# Patient Record
Sex: Female | Born: 1940 | Race: White | Hispanic: No | Smoking: Never smoker
Health system: Southern US, Community
[De-identification: ages and names within clinical notes are randomized; demographics above are authoritative.]

## PROBLEM LIST (undated history)

## (undated) DIAGNOSIS — K219 Gastro-esophageal reflux disease without esophagitis: Secondary | ICD-10-CM

## (undated) DIAGNOSIS — I10 Essential (primary) hypertension: Secondary | ICD-10-CM

## (undated) DIAGNOSIS — E119 Type 2 diabetes mellitus without complications: Secondary | ICD-10-CM

## (undated) HISTORY — PX: ABDOMINAL HYSTERECTOMY: SHX81

## (undated) HISTORY — PX: SHOULDER SURGERY: SHX246

---

## 2013-08-07 ENCOUNTER — Emergency Department (HOSPITAL_BASED_OUTPATIENT_CLINIC_OR_DEPARTMENT_OTHER): Payer: Medicare HMO

## 2013-08-07 ENCOUNTER — Encounter (HOSPITAL_BASED_OUTPATIENT_CLINIC_OR_DEPARTMENT_OTHER): Payer: Self-pay | Admitting: Emergency Medicine

## 2013-08-07 ENCOUNTER — Emergency Department (HOSPITAL_BASED_OUTPATIENT_CLINIC_OR_DEPARTMENT_OTHER)
Admission: EM | Admit: 2013-08-07 | Discharge: 2013-08-07 | Disposition: A | Discharge status: Home / Self Care | Payer: Medicare HMO | Attending: Emergency Medicine | Admitting: Emergency Medicine

## 2013-08-07 DIAGNOSIS — L02419 Cutaneous abscess of limb, unspecified: Secondary | ICD-10-CM | POA: Insufficient documentation

## 2013-08-07 DIAGNOSIS — R600 Localized edema: Secondary | ICD-10-CM

## 2013-08-07 DIAGNOSIS — R0989 Other specified symptoms and signs involving the circulatory and respiratory systems: Secondary | ICD-10-CM | POA: Insufficient documentation

## 2013-08-07 DIAGNOSIS — R0602 Shortness of breath: Secondary | ICD-10-CM | POA: Insufficient documentation

## 2013-08-07 DIAGNOSIS — E119 Type 2 diabetes mellitus without complications: Secondary | ICD-10-CM | POA: Insufficient documentation

## 2013-08-07 DIAGNOSIS — R0609 Other forms of dyspnea: Secondary | ICD-10-CM | POA: Insufficient documentation

## 2013-08-07 DIAGNOSIS — I10 Essential (primary) hypertension: Secondary | ICD-10-CM | POA: Insufficient documentation

## 2013-08-07 DIAGNOSIS — L03116 Cellulitis of left lower limb: Secondary | ICD-10-CM

## 2013-08-07 DIAGNOSIS — Z7982 Long term (current) use of aspirin: Secondary | ICD-10-CM | POA: Insufficient documentation

## 2013-08-07 DIAGNOSIS — K219 Gastro-esophageal reflux disease without esophagitis: Secondary | ICD-10-CM | POA: Insufficient documentation

## 2013-08-07 DIAGNOSIS — R609 Edema, unspecified: Secondary | ICD-10-CM | POA: Insufficient documentation

## 2013-08-07 DIAGNOSIS — L03119 Cellulitis of unspecified part of limb: Secondary | ICD-10-CM

## 2013-08-07 DIAGNOSIS — Z79899 Other long term (current) drug therapy: Secondary | ICD-10-CM | POA: Insufficient documentation

## 2013-08-07 HISTORY — DX: Type 2 diabetes mellitus without complications: E11.9

## 2013-08-07 HISTORY — DX: Gastro-esophageal reflux disease without esophagitis: K21.9

## 2013-08-07 HISTORY — DX: Essential (primary) hypertension: I10

## 2013-08-07 LAB — CBC WITH DIFFERENTIAL/PLATELET
BASOS ABS: 0.1 10*3/uL (ref 0.0–0.1)
Basophils Relative: 1 % (ref 0–1)
EOS ABS: 0.3 10*3/uL (ref 0.0–0.7)
Eosinophils Relative: 5 % (ref 0–5)
HCT: 33.4 % — ABNORMAL LOW (ref 36.0–46.0)
HEMOGLOBIN: 10.6 g/dL — AB (ref 12.0–15.0)
Lymphocytes Relative: 27 % (ref 12–46)
Lymphs Abs: 2 10*3/uL (ref 0.7–4.0)
MCH: 27.5 pg (ref 26.0–34.0)
MCHC: 31.7 g/dL (ref 30.0–36.0)
MCV: 86.5 fL (ref 78.0–100.0)
MONOS PCT: 12 % (ref 3–12)
Monocytes Absolute: 0.9 10*3/uL (ref 0.1–1.0)
NEUTROS ABS: 4.1 10*3/uL (ref 1.7–7.7)
NEUTROS PCT: 56 % (ref 43–77)
Platelets: 256 10*3/uL (ref 150–400)
RBC: 3.86 MIL/uL — ABNORMAL LOW (ref 3.87–5.11)
RDW: 14.8 % (ref 11.5–15.5)
WBC: 7.4 10*3/uL (ref 4.0–10.5)

## 2013-08-07 LAB — TROPONIN I: Troponin I: <0.3 ng/mL (ref <0.30)

## 2013-08-07 LAB — BASIC METABOLIC PANEL
ANION GAP: 14 (ref 5–15)
BUN: 24 mg/dL — ABNORMAL HIGH (ref 6–23)
CHLORIDE: 102 meq/L (ref 96–112)
CO2: 25 mEq/L (ref 19–32)
Calcium: 9.5 mg/dL (ref 8.4–10.5)
Creatinine, Ser: 1.1 mg/dL (ref 0.50–1.10)
GFR, EST AFRICAN AMERICAN: 56 mL/min — AB (ref >90)
GFR, EST NON AFRICAN AMERICAN: 48 mL/min — AB (ref >90)
Glucose, Bld: 106 mg/dL — ABNORMAL HIGH (ref 70–99)
POTASSIUM: 4.4 meq/L (ref 3.7–5.3)
Sodium: 141 mEq/L (ref 137–147)

## 2013-08-07 LAB — PRO B NATRIURETIC PEPTIDE: PRO B NATRI PEPTIDE: 602.9 pg/mL — AB (ref 0–125)

## 2013-08-07 MED ORDER — CLINDAMYCIN HCL 150 MG PO CAPS: 450.0000 mg | ORAL_CAPSULE | Freq: Three times a day (TID) | ORAL | Status: AC

## 2013-08-07 NOTE — ED Notes (Signed)
Left leg pain for a week. Lower leg is red, swollen, painful with blisters on her skin.

## 2013-08-07 NOTE — ED Provider Notes (Signed)
CSN: [id number]     Arrival date & time 08/07/13  1457 History   First MD Initiated Contact with Patient 08/07/13 1528     Chief Complaint  Patient presents with  . Leg Pain     (Consider location/radiation/quality/duration/timing/severity/associated sxs/prior Treatment) Patient is a 73 y.o. female presenting with leg pain and shortness of breath.  Leg Pain Location:  Leg Leg location:  L lower leg Pain details:    Quality:  Burning   Radiates to:  Does not radiate   Severity:  Moderate   Onset quality:  Gradual   Duration: several days.   Timing:  Constant   Progression:  Worsening Chronicity:  Recurrent ("last time they treated me with antibiotics") Relieved by:  Nothing Worsened by:  Nothing tried Associated symptoms: swelling (BLE)   Associated symptoms: no fever and no numbness   Shortness of Breath Severity:  Moderate Onset quality:  Gradual Duration:  1 week Timing:  Constant Progression:  Worsening Chronicity:  New Context comment:  Working at a [employer] for past 3 weeks, been on feet much more than usual. \ Relieved by:  Nothing Exacerbated by: lying flat, exertion. Associated symptoms: no abdominal pain, no chest pain, no cough, no fever and no vomiting     Past Medical History  Diagnosis Date  . Hypertension   . Diabetes mellitus without complication   . GERD (gastroesophageal reflux disease)    Past Surgical History  Procedure Laterality Date  . Shoulder surgery    . Abdominal hysterectomy     No family history on file. History  Substance Use Topics  . Smoking status: Never Smoker   . Smokeless tobacco: Not on file  . Alcohol Use: No   OB History   Grav Para Term Preterm Abortions TAB SAB Ect Mult Living                 Review of Systems  Constitutional: Negative for fever.  Respiratory: Positive for shortness of breath. Negative for cough.   Cardiovascular: Negative for chest pain.  Gastrointestinal: Negative for nausea,  vomiting, abdominal pain and diarrhea.  All other systems reviewed and are negative.     Allergies  Review of patient's allergies indicates no known allergies.  Home Medications   Prior to Admission medications   Medication Sig Start Date End Date Taking? Authorizing Provider  Aspirin (ASPIR-81 PO) Take by mouth.   Yes Historical Provider, MD  LOVASTATIN PO Take by mouth.   Yes Historical Provider, MD  METFORMIN HCL PO Take by mouth.   Yes Historical Provider, MD  Omeprazole (PRILOSEC PO) Take by mouth.   Yes Historical Provider, MD  verapamil (CALAN) 120 MG tablet Take 120 mg by mouth 2 (two) times daily.   Yes Historical Provider, MD   BP 157/58  Pulse 72  Temp(Src) 97.9 F (36.6 C) (Oral)  Resp 18  Ht 5\' 2"  (1.575 m)  Wt 220 lb (99.791 kg)  BMI 40.23 kg/m2  SpO2 97% Physical Exam  Nursing note and vitals reviewed. Constitutional: She is oriented to person, place, and time. She appears well-developed and well-nourished. No distress.  HENT:  Head: Normocephalic and atraumatic.  Mouth/Throat: Oropharynx is clear and moist.  Eyes: Conjunctivae are normal. Pupils are equal, round, and reactive to light. No scleral icterus.  Neck: Neck supple.  Cardiovascular: Normal rate, normal heart sounds and intact distal pulses.  An irregularly irregular rhythm present.  No murmur heard. Pulmonary/Chest: Effort normal. No stridor. No respiratory  distress. She has rales (mild, bibasilar. Greater on left than right. ).  Abdominal: Soft. Bowel sounds are normal. She exhibits no distension. There is no tenderness. There is no guarding.  Musculoskeletal: Normal range of motion. She exhibits edema (BLE.  Pitting worse on left, but overall circumference on right is noticeably greater. ).  Neurological: She is alert and oriented to person, place, and time.  Skin: Skin is warm and dry. No rash noted.  Left lower leg: Erythema of ankle spreading slightly into foot.  On anterior shin there are a  few ulcerations.  No vesicles.    Psychiatric: She has a normal mood and affect. Her behavior is normal.    ED Course  Procedures (including critical care time) Labs Review Labs Reviewed  CBC WITH DIFFERENTIAL - Abnormal; Notable for the following:    RBC 3.86 (*)    Hemoglobin 10.6 (*)    HCT 33.4 (*)    All other components within normal limits  BASIC METABOLIC PANEL - Abnormal; Notable for the following:    Glucose, Bld 106 (*)    BUN 24 (*)    GFR calc non Af Amer 48 (*)    GFR calc Af Amer 56 (*)    All other components within normal limits  PRO B NATRIURETIC PEPTIDE - Abnormal; Notable for the following:    Pro B Natriuretic peptide (BNP) 602.9 (*)    All other components within normal limits  TROPONIN I    Imaging Review Dg Chest 2 View  08/07/2013   CLINICAL DATA:  Shortness of breath, lower extremity edema  EXAM: CHEST  2 VIEW  COMPARISON:  None.  FINDINGS: Mild to moderate cardiac enlargement. Vascular pattern normal. No edema or consolidation. No pleural effusions.  IMPRESSION: Cardiac enlargement with no acute findings.   Electronically Signed   By: Esperanza Heir M.D.   On: 08/07/2013 16:43   US Venous Img Lower Bilateral  08/07/2013   CLINICAL DATA:  Bilateral leg swelling.  EXAM: BILATERAL LOWER EXTREMITY VENOUS DOPPLER ULTRASOUND  TECHNIQUE: Gray-scale sonography with graded compression, as well as color Doppler and duplex ultrasound were performed to evaluate the lower extremity deep venous systems from the level of the common femoral vein and including the common femoral, femoral, profunda femoral, popliteal and calf veins including the posterior tibial, peroneal and gastrocnemius veins when visible. The superficial great saphenous vein was also interrogated. Spectral Doppler was utilized to evaluate flow at rest and with distal augmentation maneuvers in the common femoral, femoral and popliteal veins.  COMPARISON:  None.  FINDINGS: RIGHT LOWER EXTREMITY  Common Femoral  Vein: No evidence of thrombus. Normal compressibility, respiratory phasicity and response to augmentation.  Saphenofemoral Junction: No evidence of thrombus. Normal compressibility and flow on color Doppler imaging.  Profunda Femoral Vein: No evidence of thrombus. Normal compressibility and flow on color Doppler imaging.  Femoral Vein: No evidence of thrombus. Normal compressibility, respiratory phasicity and response to augmentation.  Popliteal Vein: No evidence of thrombus. Normal compressibility, respiratory phasicity and response to augmentation.  Calf Veins: No evidence of thrombus. Normal compressibility and flow on color Doppler imaging.  Superficial Great Saphenous Vein: No evidence of thrombus. Normal compressibility and flow on color Doppler imaging.  Venous Reflux:  None.  Other Findings:  None.  LEFT LOWER EXTREMITY  Common Femoral Vein: No evidence of thrombus. Normal compressibility, respiratory phasicity and response to augmentation.  Saphenofemoral Junction: No evidence of thrombus. Normal compressibility and flow on color Doppler imaging.  Profunda Femoral Vein: No evidence of thrombus. Normal compressibility and flow on color Doppler imaging.  Femoral Vein: No evidence of thrombus. Normal compressibility, respiratory phasicity and response to augmentation.  Popliteal Vein: No evidence of thrombus. Normal compressibility, respiratory phasicity and response to augmentation.  Calf Veins: No evidence of thrombus. Normal compressibility and flow on color Doppler imaging.  Superficial Great Saphenous Vein: No evidence of thrombus. Normal compressibility and flow on color Doppler imaging.  Venous Reflux:  None.  Other Findings: There is a thrombosed superficial varicosity in the medial upper calf area which was very tender to touch.  IMPRESSION: No evidence of deep venous thrombosis in bilateral lower extremities.  Superficial thrombophlebitis in a superficial varicosity in the medial upper calf area on  the left.   Electronically Signed   By: Loralie ChampagneMark  Gallerani M.D.   On: 08/07/2013 17:48     EKG Interpretation   Date/Time:  Thursday August 07 2013 16:17:02 EDT Ventricular Rate:  62 PR Interval:  208 QRS Duration: 94 QT Interval:  426 QTC Calculation: 432 R Axis:   6 Text Interpretation:  Sinus rhythm with marked sinus arrhythmia Otherwise  normal ECG No old tracing to compare Confirmed by [facility]  MD, TREY ([id number])  on 08/07/2013 6:15:53 PM      MDM   Final diagnoses:  Bilateral lower extremity edema  Cellulitis of left leg    73 year old female presenting with left lower leg pain, swelling, redness. These symptoms have progressed over the past week. He also endorses shortness of breath, dyspnea on exertion, orthopnea. On exam, both legs are edematous. Left lower leg appears cellulitic. Lower extremity circumferences are different, so will obtain ultrasound. We'll also check chest x-ray, BNP.  US negative for DVT.  Remainder of workup unremarkable.  Plan dc home with treatment for her cellulitis.    Candyce ChurnJohn David Shakir Petrosino III, MD 08/07/13 1900

## 2013-08-07 NOTE — Discharge Instructions (Signed)
Cellulitis  Cellulitis is an infection of the skin and the tissue beneath it. The infected area is usually red and tender. Cellulitis occurs most often in the arms and lower legs.   CAUSES   Cellulitis is caused by bacteria that enter the skin through cracks or cuts in the skin. The most common types of bacteria that cause cellulitis are Staphylococcus and Streptococcus.  SYMPTOMS    Redness and warmth.   Swelling.   Tenderness or pain.   Fever.  DIAGNOSIS   Your caregiver can usually determine what is wrong based on a physical exam. Blood tests may also be done.  TREATMENT   Treatment usually involves taking an antibiotic medicine.  HOME CARE INSTRUCTIONS    Take your antibiotics as directed. Finish them even if you start to feel better.   Keep the infected arm or leg elevated to reduce swelling.   Apply a warm cloth to the affected area up to 4 times per day to relieve pain.   Only take over-the-counter or prescription medicines for pain, discomfort, or fever as directed by your caregiver.   Keep all follow-up appointments as directed by your caregiver.  SEEK MEDICAL CARE IF:    You notice red streaks coming from the infected area.   Your red area gets larger or turns dark in color.   Your bone or joint underneath the infected area becomes painful after the skin has healed.   Your infection returns in the same area or another area.   You notice a swollen bump in the infected area.   You develop new symptoms.  SEEK IMMEDIATE MEDICAL CARE IF:    You have a fever.   You feel very sleepy.   You develop vomiting or diarrhea.   You have a general ill feeling (malaise) with muscle aches and pains.  MAKE SURE YOU:    Understand these instructions.   Will watch your condition.   Will get help right away if you are not doing well or get worse.  Document Released: 11/02/2004 Document Revised: 07/25/2011 Document Reviewed: 04/10/2011  Exit[employer] Patient Information 2015 [employer]. This information is  not intended to replace advice given to you by your health care provider. Make sure you discuss any questions you have with your health care provider.    Edema  Edema is an abnormal buildup of fluids in your bodytissues. Edema is somewhatdependent on gravity to pull the fluid to the lowest place in your body. That makes the condition more common in the legs and thighs (lower extremities). Painless swelling of the feet and ankles is common and becomes more likely as you get older. It is also common in looser tissues, like around your eyes.   When the affected area is squeezed, the fluid may move out of that spot and leave a dent for a few moments. This dent is called pitting.   CAUSES   There are many possible causes of edema. Eating too much salt and being on your feet or sitting for a long time can cause edema in your legs and ankles. Hot weather may make edema worse. Common medical causes of edema include:   Heart failure.   Liver disease.   Kidney disease.   Weak blood vessels in your legs.   Cancer.   An injury.   Pregnancy.   Some medications.   Obesity.  SYMPTOMS   Edema is usually painless.Your skin may look swollen or shiny.   DIAGNOSIS   Your health   care provider may be able to diagnose edema by asking about your medical history and doing a physical exam. You may need to have tests such as X-rays, an electrocardiogram, or blood tests to check for medical conditions that may cause edema.   TREATMENT   Edema treatment depends on the cause. If you have heart, liver, or kidney disease, you need the treatment appropriate for these conditions. General treatment may include:   Elevation of the affected body part above the level of your heart.   Compression of the affected body part. Pressure from elastic bandages or support stockings squeezes the tissues and forces fluid back into the blood vessels. This keeps fluid from entering the tissues.   Restriction of fluid and salt intake.   Use of a  water pill (diuretic). These medications are appropriate only for some types of edema. They pull fluid out of your body and make you urinate more often. This gets rid of fluid and reduces swelling, but diuretics can have side effects. Only use diuretics as directed by your health care provider.  HOME CARE INSTRUCTIONS    Keep the affected body part above the level of your heart when you are lying down.    Do not sit still or stand for prolonged periods.    Do not put anything directly under your knees when lying down.   Do not wear constricting clothing or garters on your upper legs.    Exercise your legs to work the fluid back into your blood vessels. This may help the swelling go down.    Wear elastic bandages or support stockings to reduce ankle swelling as directed by your health care provider.    Eat a low-salt diet to reduce fluid if your health care provider recommends it.    Only take medicines as directed by your health care provider.  SEEK MEDICAL CARE IF:    Your edema is not responding to treatment.   You have heart, liver, or kidney disease and notice symptoms of edema.   You have edema in your legs that does not improve after elevating them.    You have sudden and unexplained weight gain.  SEEK IMMEDIATE MEDICAL CARE IF:    You develop shortness of breath or chest pain.    You cannot breathe when you lie down.   You develop pain, redness, or warmth in the swollen areas.    You have heart, liver, or kidney disease and suddenly get edema.   You have a fever and your symptoms suddenly get worse.  MAKE SURE YOU:    Understand these instructions.   Will watch your condition.   Will get help right away if you are not doing well or get worse.  Document Released: 01/23/2005 Document Revised: 01/28/2013 Document Reviewed: 11/15/2012  [employer] Patient Information 2015 [employer]. This information is not intended to replace advice given to you by your health care provider. Make  sure you discuss any questions you have with your health care provider.

## 2014-11-10 IMAGING — CR DG CHEST 2V
1 series · 1 of 1 positions shown · non-contrast
Comparison: None.

CLINICAL DATA: Shortness of breath, lower extremity edema

EXAM:
CHEST  2 VIEW

[view not recorded]
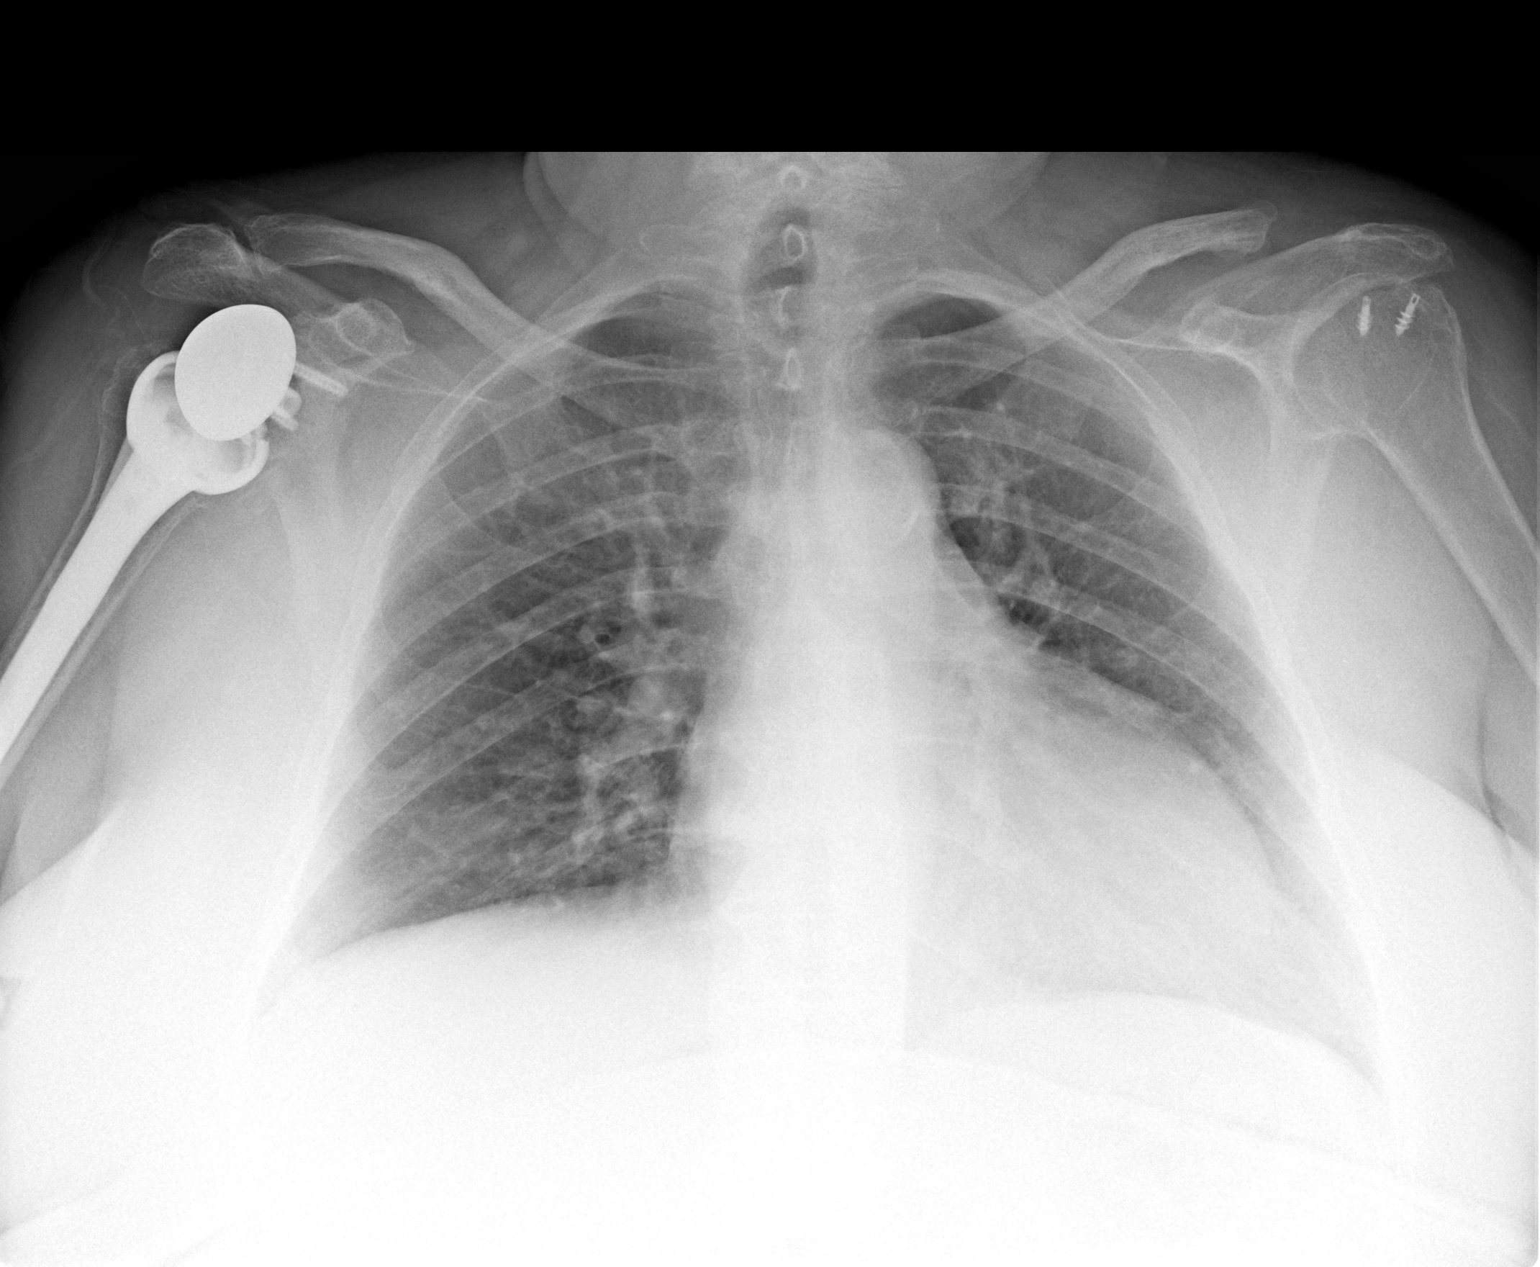

[1 of 1 positions shown; findings below may reference images not displayed]

FINDINGS: Mild to moderate cardiac enlargement. Vascular pattern normal. No
edema or consolidation. No pleural effusions.
IMPRESSION: Cardiac enlargement with no acute findings.

## 2014-11-10 IMAGING — US US EXTREM LOW VENOUS BILAT
1 series · 13 of 24 positions shown · non-contrast
Comparison: None.

CLINICAL DATA: Bilateral leg swelling.



[Series 1: us extrem low venous bilat · 0.16mm/px · 41 acquisitions, 13 frames shown]
[im 1/41]
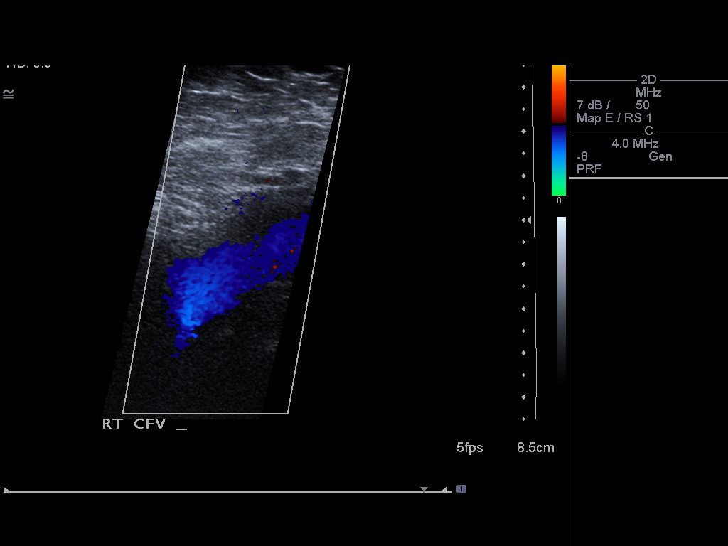
[im 4/41]
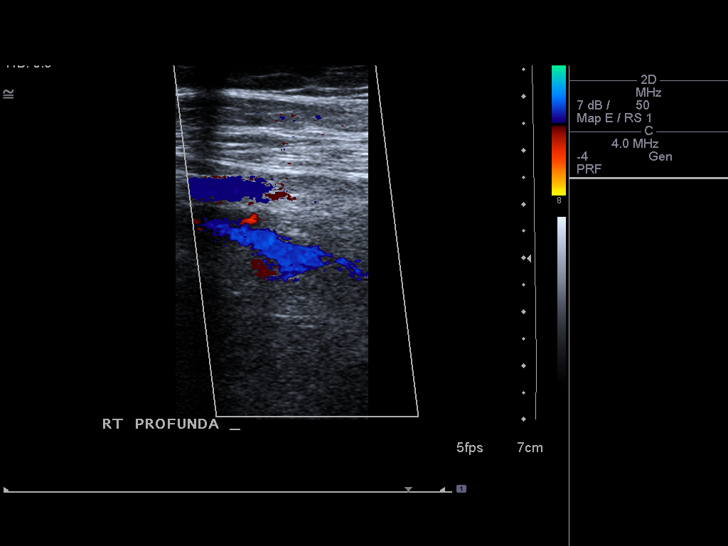
[im 7/41]
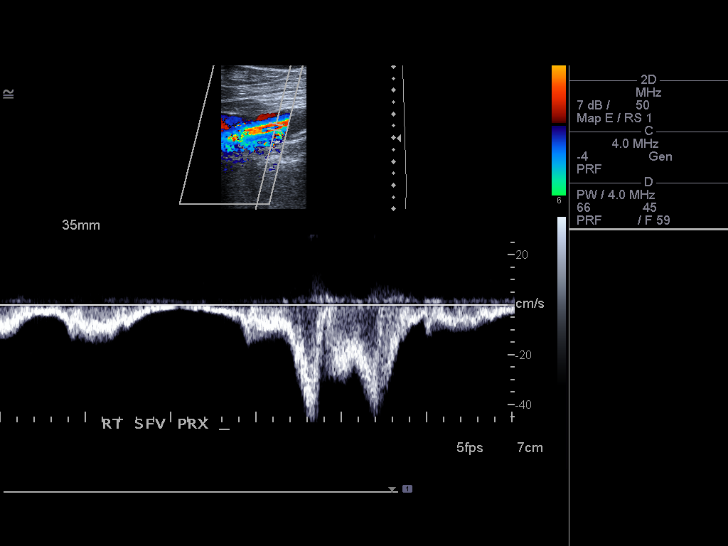
[im 11/41]
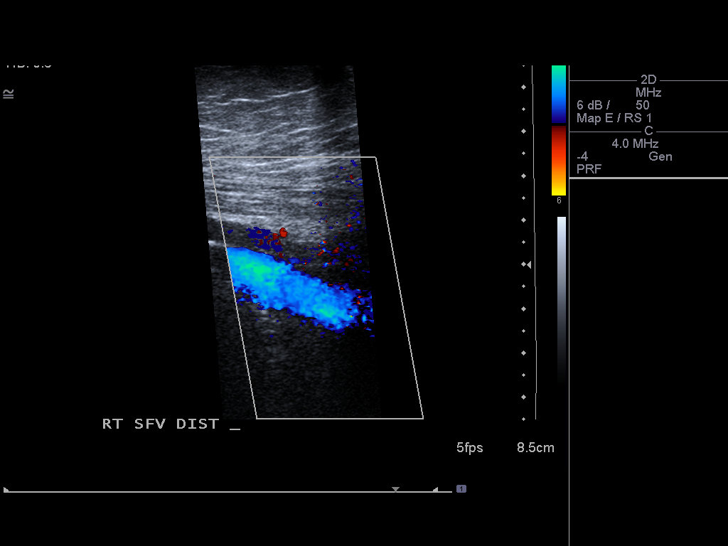
[im 14/41]
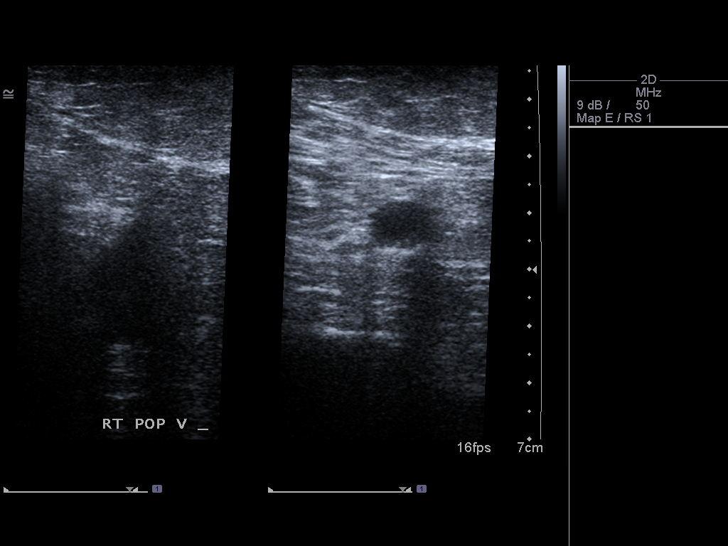
[im 16/41]
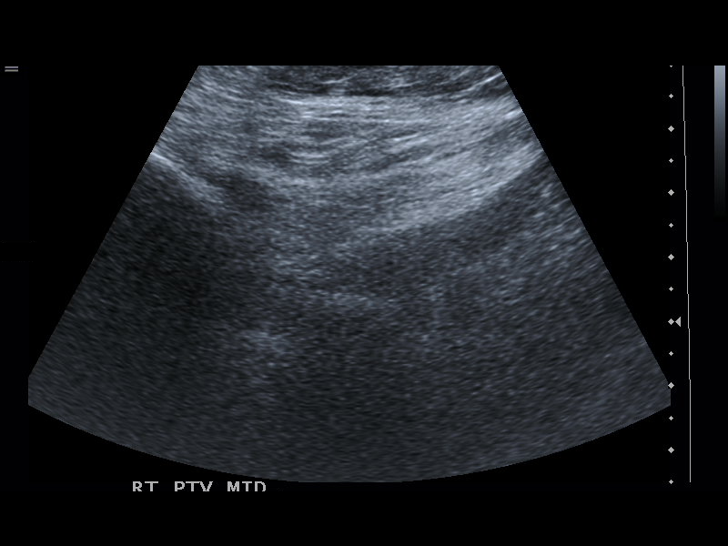
[im 21/41]
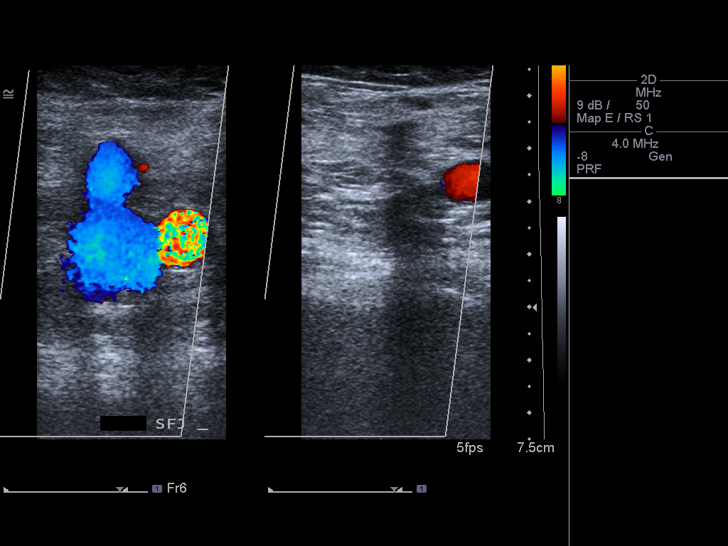
[im 23/41]
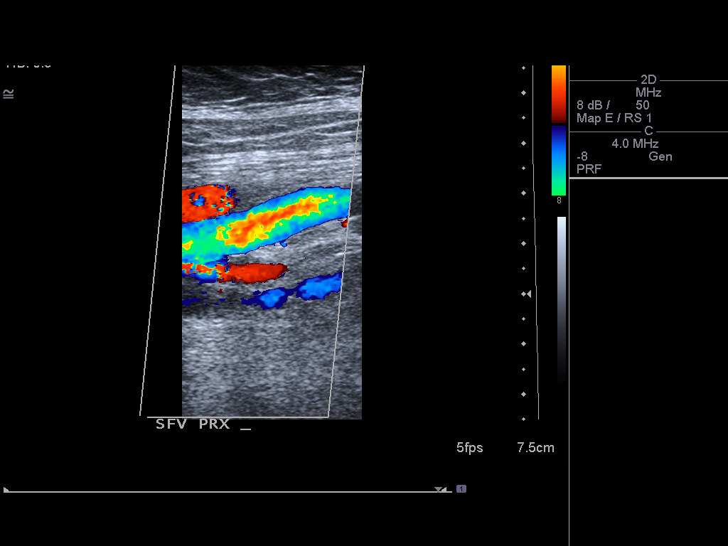
[im 27/41]
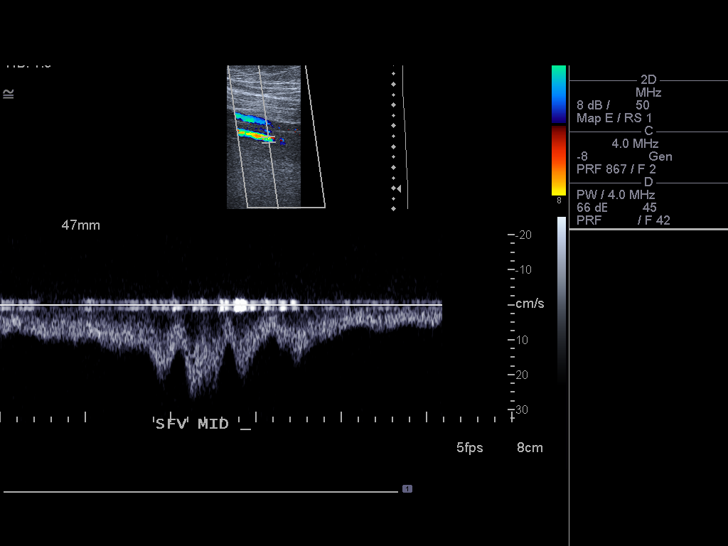
[im 30/41]
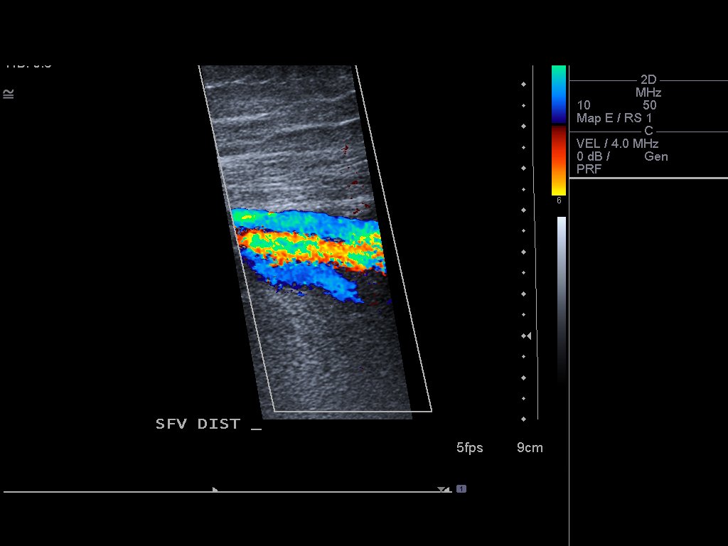
[im 34/41]
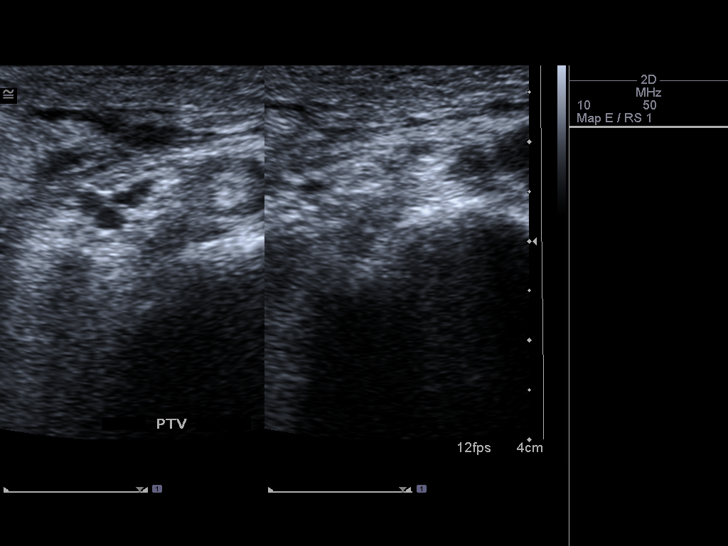
[im 37/41]
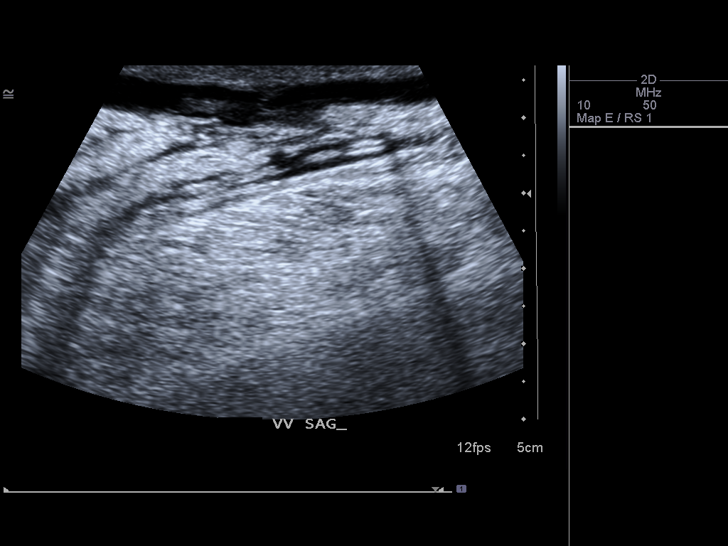
[im 41/41]
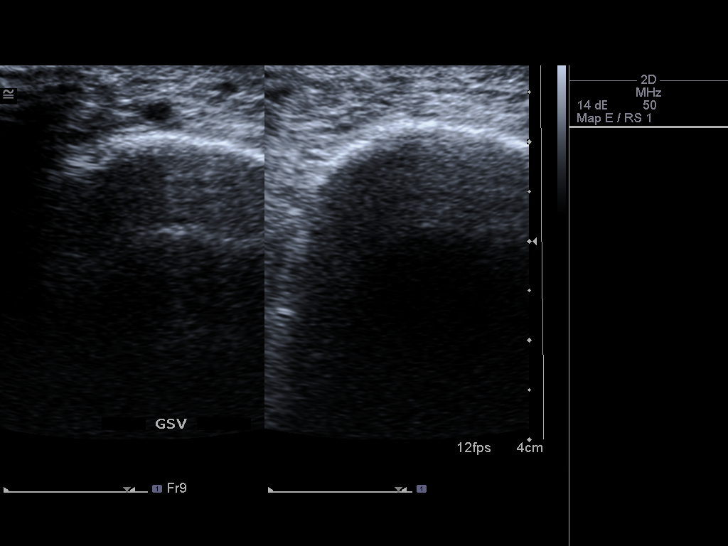

[13 of 24 positions shown; findings below may reference images not displayed]

FINDINGS: RIGHT LOWER EXTREMITY

Common Femoral Vein: No evidence of thrombus. Normal
compressibility, respiratory phasicity and response to augmentation.

Saphenofemoral Junction: No evidence of thrombus. Normal
compressibility and flow on color Doppler imaging.

Profunda Femoral Vein: No evidence of thrombus. Normal
compressibility and flow on color Doppler imaging.

Femoral Vein: No evidence of thrombus. Normal compressibility,
respiratory phasicity and response to augmentation.

Popliteal Vein: No evidence of thrombus. Normal compressibility,
respiratory phasicity and response to augmentation.

Calf Veins: No evidence of thrombus. Normal compressibility and flow
on color Doppler imaging.

Superficial Great Saphenous Vein: No evidence of thrombus. Normal
compressibility and flow on color Doppler imaging.

Venous Reflux:  None.

Other Findings:  None.

LEFT LOWER EXTREMITY

Common Femoral Vein: No evidence of thrombus. Normal
compressibility, respiratory phasicity and response to augmentation.

Saphenofemoral Junction: No evidence of thrombus. Normal
compressibility and flow on color Doppler imaging.

Profunda Femoral Vein: No evidence of thrombus. Normal
compressibility and flow on color Doppler imaging.

Femoral Vein: No evidence of thrombus. Normal compressibility,
respiratory phasicity and response to augmentation.

Popliteal Vein: No evidence of thrombus. Normal compressibility,
respiratory phasicity and response to augmentation.

Calf Veins: No evidence of thrombus. Normal compressibility and flow
on color Doppler imaging.

Superficial Great Saphenous Vein: No evidence of thrombus. Normal
compressibility and flow on color Doppler imaging.

Venous Reflux:  None.

Other Findings: There is a thrombosed superficial varicosity in the
medial upper calf area which was very tender to touch.
IMPRESSION: No evidence of deep venous thrombosis in bilateral lower
extremities.

Superficial thrombophlebitis in a superficial varicosity in the
medial upper calf area on the left.

## 2016-06-21 ENCOUNTER — Encounter: Payer: Self-pay | Admitting: Nurse Practitioner

## 2016-06-21 NOTE — Progress Notes (Signed)
This encounter was created in error - please disregard.
